# Patient Record
Sex: Female | Born: 1965 | Race: White | Hispanic: No | Marital: Married
Health system: Southern US, Community
[De-identification: ages and names within clinical notes are randomized; demographics above are authoritative.]

---

## 2013-08-17 ENCOUNTER — Ambulatory Visit: Payer: Self-pay | Admitting: Anesthesiology

## 2013-08-17 DIAGNOSIS — I10 Essential (primary) hypertension: Secondary | ICD-10-CM

## 2013-08-24 ENCOUNTER — Ambulatory Visit: Payer: Self-pay | Admitting: Urology

## 2013-08-24 LAB — CBC WITH DIFFERENTIAL/PLATELET
Basophil #: 0.1 10*3/uL (ref 0.0–0.1)
Basophil %: 1.1 %
Eosinophil %: 0.6 %
HGB: 10.1 g/dL — ABNORMAL LOW (ref 12.0–16.0)
MCH: 25.7 pg — ABNORMAL LOW (ref 26.0–34.0)
MCHC: 32.7 g/dL (ref 32.0–36.0)
MCV: 79 fL — ABNORMAL LOW (ref 80–100)
Monocyte #: 0.4 x10 3/mm (ref 0.2–0.9)
Monocyte %: 7.5 %
Neutrophil #: 3.9 10*3/uL (ref 1.4–6.5)
Neutrophil %: 67.5 %
Platelet: 319 10*3/uL (ref 150–440)
RBC: 3.95 10*6/uL (ref 3.80–5.20)
RDW: 14.1 % (ref 11.5–14.5)
WBC: 5.8 10*3/uL (ref 3.6–11.0)

## 2013-08-24 LAB — BASIC METABOLIC PANEL
Calcium, Total: 8.7 mg/dL (ref 8.5–10.1)
Chloride: 105 mmol/L (ref 98–107)
Creatinine: 0.68 mg/dL (ref 0.60–1.30)
EGFR (African American): >60
Osmolality: 272 (ref 275–301)

## 2013-08-25 LAB — CBC WITH DIFFERENTIAL/PLATELET
Basophil #: 0 10*3/uL (ref 0.0–0.1)
Basophil %: 0.2 %
Eosinophil #: 0 10*3/uL (ref 0.0–0.7)
HCT: 26.9 % — ABNORMAL LOW (ref 35.0–47.0)
Lymphocyte #: 1.2 10*3/uL (ref 1.0–3.6)
MCHC: 32.5 g/dL (ref 32.0–36.0)
Neutrophil %: 85.2 %
RDW: 14.4 % (ref 11.5–14.5)

## 2013-08-25 LAB — BASIC METABOLIC PANEL
Anion Gap: 3 — ABNORMAL LOW (ref 7–16)
BUN: 9 mg/dL (ref 7–18)
Calcium, Total: 8.4 mg/dL — ABNORMAL LOW (ref 8.5–10.1)
Chloride: 108 mmol/L — ABNORMAL HIGH (ref 98–107)
EGFR (African American): >60
EGFR (Non-African Amer.): >60
Glucose: 107 mg/dL — ABNORMAL HIGH (ref 65–99)
Osmolality: 271 (ref 275–301)
Potassium: 4.3 mmol/L (ref 3.5–5.1)
Sodium: 136 mmol/L (ref 136–145)

## 2013-10-12 ENCOUNTER — Ambulatory Visit: Payer: Self-pay | Admitting: Urology

## 2014-11-08 IMAGING — XA IR NG/OG TUBE BY MD
1 series · 2 of 2 positions shown · non-contrast
Comparison: none

CLINICAL DATA: Symptomatic right nephrolithiasis. Preop for
nephrolithotomy.

EXAM:
RIGHT PERCUTANEOUS NEPHROURETERAL CATHETER PLACEMENT UNDER
ULTRASOUND AND FLUOROSCOPIC GUIDANCE
TECHNIQUE: The procedure, risks (including but not limited to bleeding,
infection, organ damage ), benefits, and alternatives were explained
to the patient. Questions regarding the procedure were encouraged
and answered. The patient understands and consents to the procedure.

[Series 3: fl - angio · 2 of 2 slices shown]
[im 1/2]
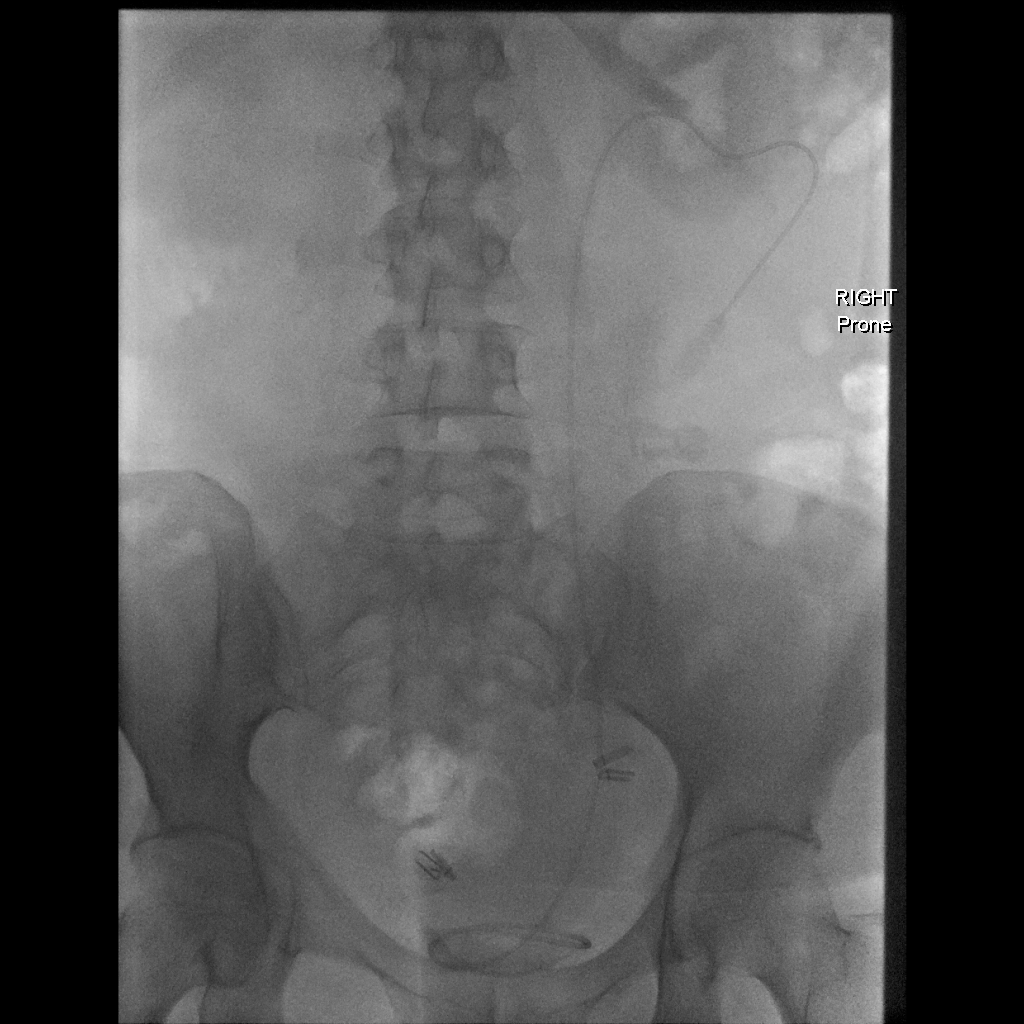
[im 2/2]
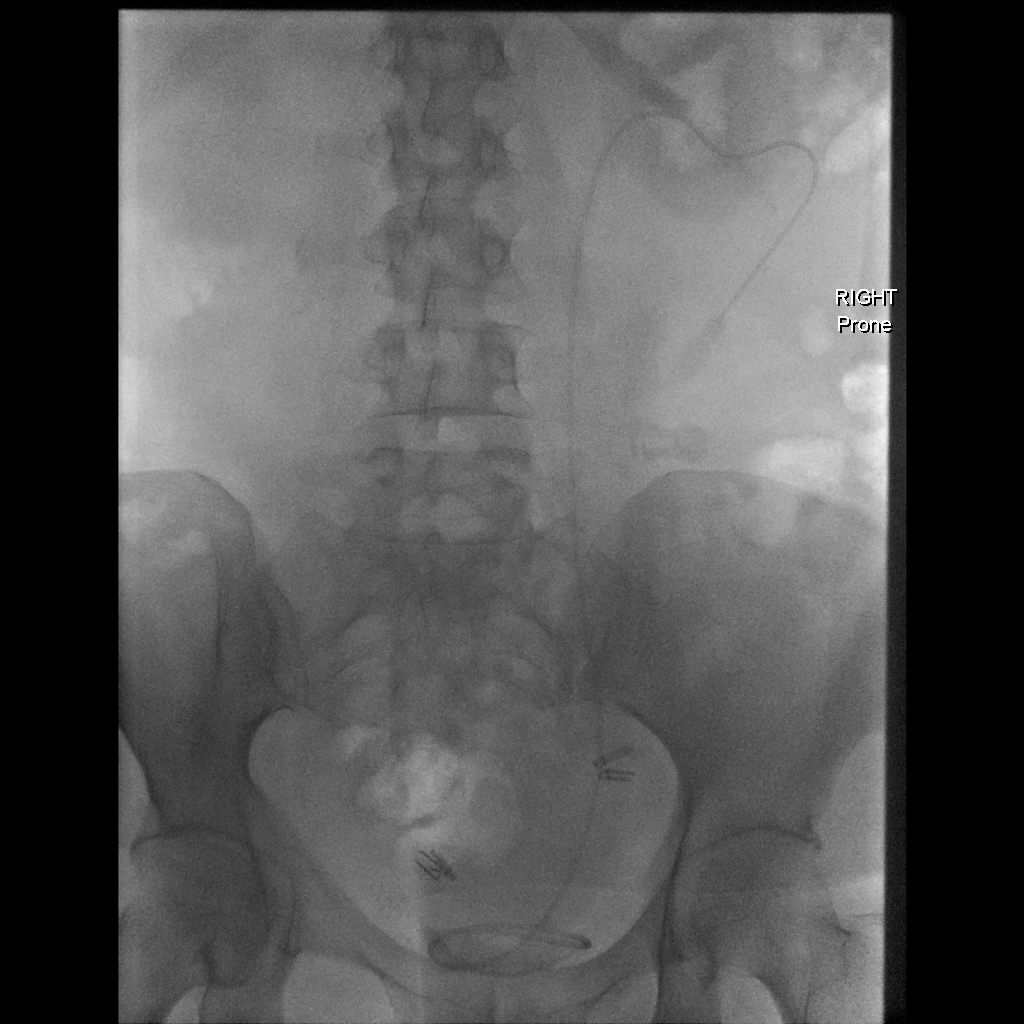

[2 of 2 positions shown; findings below may reference images not displayed]

RightFlank region prepped , draped in usual sterile fashion,
infiltrated locally with 1% lidocaine.

Intravenous Fentanyl and Versed were administered as conscious
sedation during continuous cardiorespiratory monitoring by the
radiology RN, with a total moderate sedation time of less than 30
minutes.

Under real-time ultrasound guidance, a 21-gauge trocar needle was
advanced into a posterior lower pole calyx directed towards the
dominant visible stone. Ultrasound image documentation was saved.
Under fluoroscopy, a 018 guidewire advanced easily around the stone
and down the ureter. Needle was exchanged over a guidewire for 3
French dilator. Contrast injection confirmed appropriate
positioning. Catheter was exchanged over a guidewire for a 5 French
angiographic catheter, advanced into the urinary bladder. Spot
radiograph confirms appropriate positioning. Catheter secured
externally with 0 Prolene suture and capped. The patient tolerated
the procedure well. No immediate complication.

FLUOROSCOPY TIME:  16 seconds
IMPRESSION: 1. Technically successful right antegrade percutaneous
nephroureteral catheter placement.

## 2014-11-08 IMAGING — US US INTRAOPERATIVE
1 series · 3 of 3 positions shown · non-contrast
Comparison: none

CLINICAL DATA: Symptomatic right nephrolithiasis. Preop for
nephrolithotomy.

EXAM:
RIGHT PERCUTANEOUS NEPHROURETERAL CATHETER PLACEMENT UNDER
ULTRASOUND AND FLUOROSCOPIC GUIDANCE
TECHNIQUE: The procedure, risks (including but not limited to bleeding,
infection, organ damage ), benefits, and alternatives were explained
to the patient. Questions regarding the procedure were encouraged
and answered. The patient understands and consents to the procedure.

[Series 1: us intraoperative · 0.28mm/px · 3 of 3 slices shown]
[im 1/3]
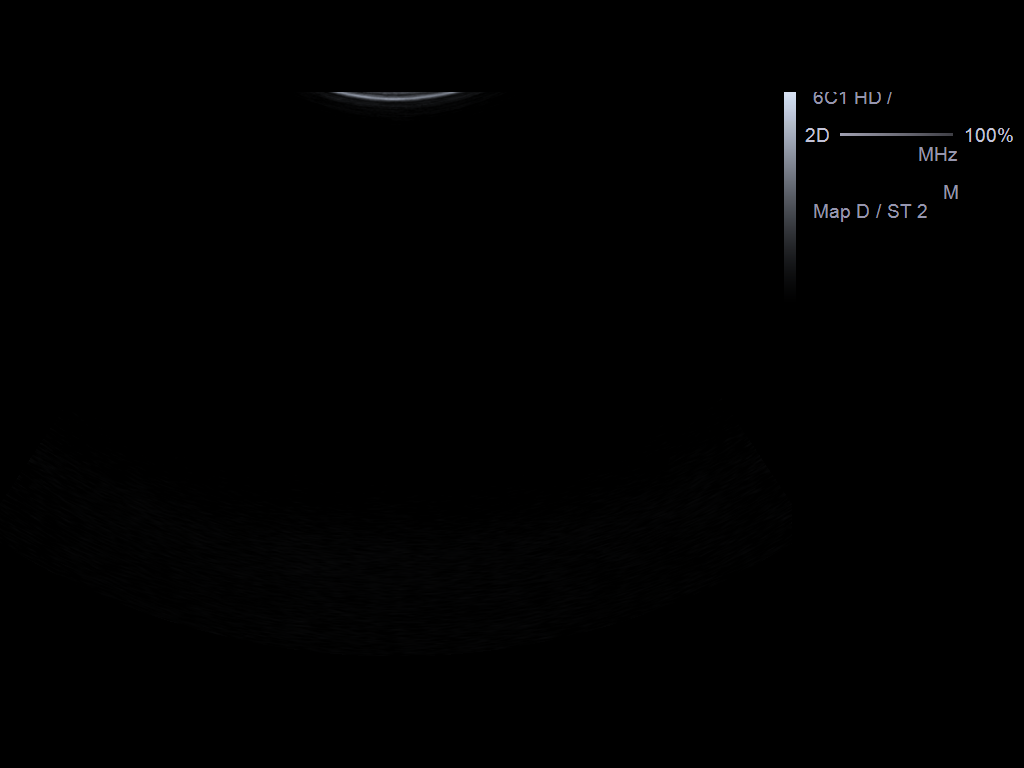
[im 2/3]
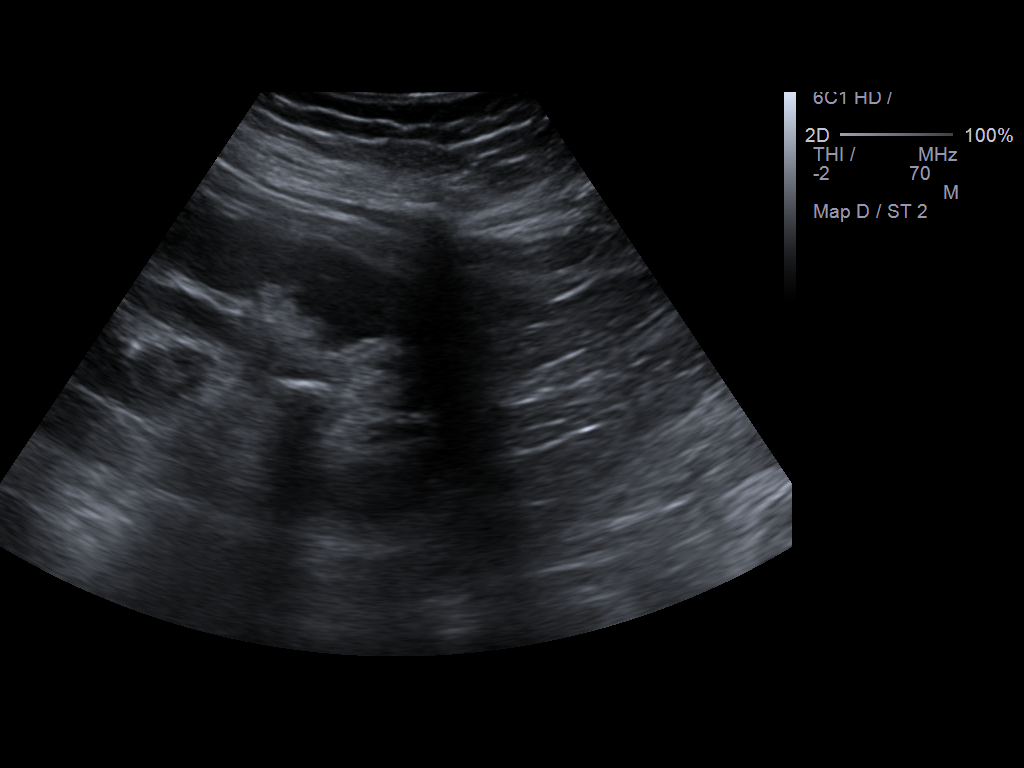
[im 3/3]
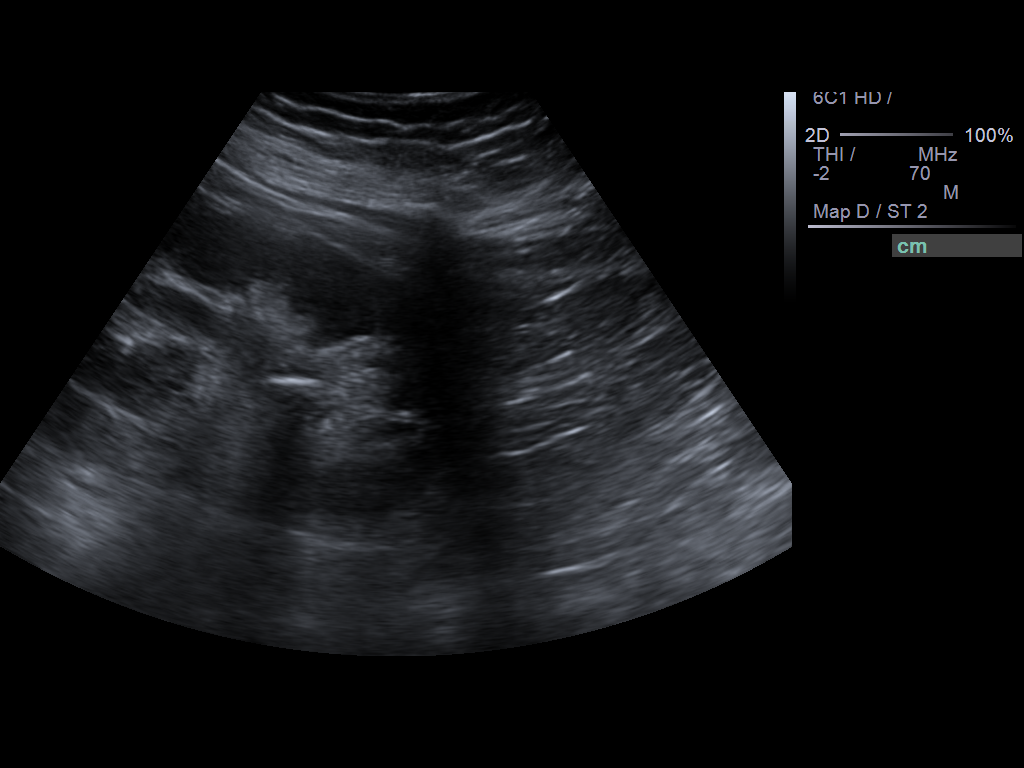

[3 of 3 positions shown; findings below may reference images not displayed]

RightFlank region prepped , draped in usual sterile fashion,
infiltrated locally with 1% lidocaine.

Intravenous Fentanyl and Versed were administered as conscious
sedation during continuous cardiorespiratory monitoring by the
radiology RN, with a total moderate sedation time of less than 30
minutes.

Under real-time ultrasound guidance, a 21-gauge trocar needle was
advanced into a posterior lower pole calyx directed towards the
dominant visible stone. Ultrasound image documentation was saved.
Under fluoroscopy, a 018 guidewire advanced easily around the stone
and down the ureter. Needle was exchanged over a guidewire for 3
French dilator. Contrast injection confirmed appropriate
positioning. Catheter was exchanged over a guidewire for a 5 French
angiographic catheter, advanced into the urinary bladder. Spot
radiograph confirms appropriate positioning. Catheter secured
externally with 0 Prolene suture and capped. The patient tolerated
the procedure well. No immediate complication.

FLUOROSCOPY TIME:  16 seconds
IMPRESSION: 1. Technically successful right antegrade percutaneous
nephroureteral catheter placement.

## 2014-11-09 IMAGING — XA IR NG/OG TUBE BY MD
10 series · 10 of 10 positions shown · non-contrast
Comparison: none

CLINICAL DATA: Recent percutaneous nephrolithotomy. Evaluate for
patency of the collecting system.

[Series 1: single · 1 of 1 slices shown (1 of 7)]
[im 1/1]
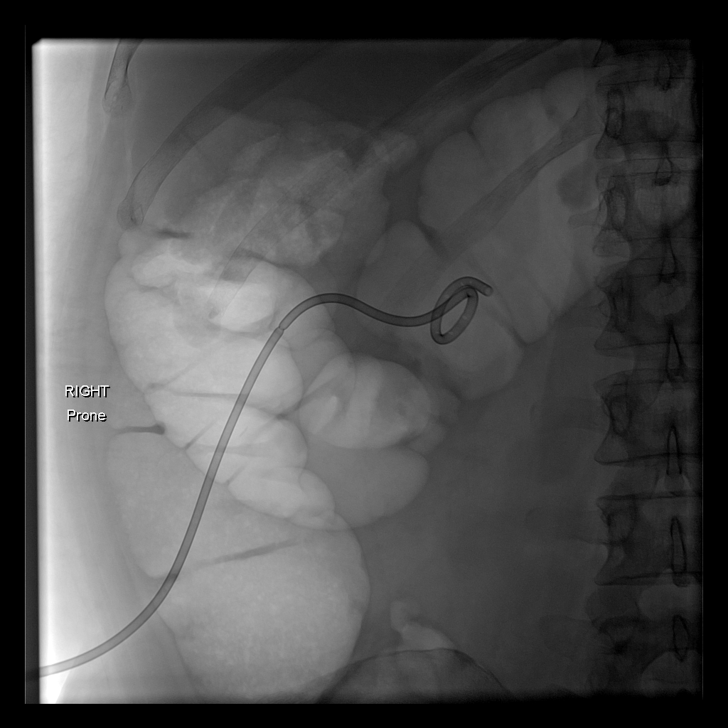

[Series 2: single · 1 of 1 slices shown (2 of 7)]
[im 1/1]
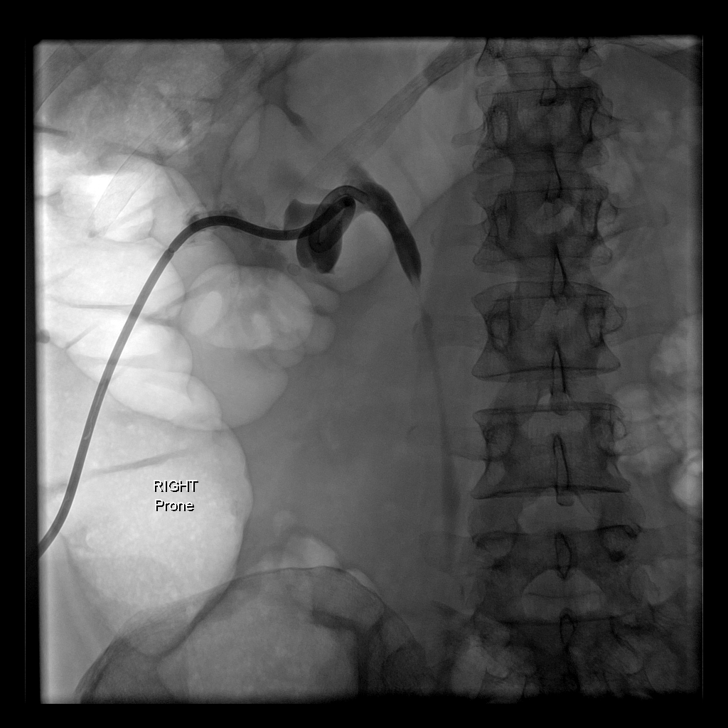

[Series 3: single · 1 of 1 slices shown (3 of 7)]
[im 1/1]
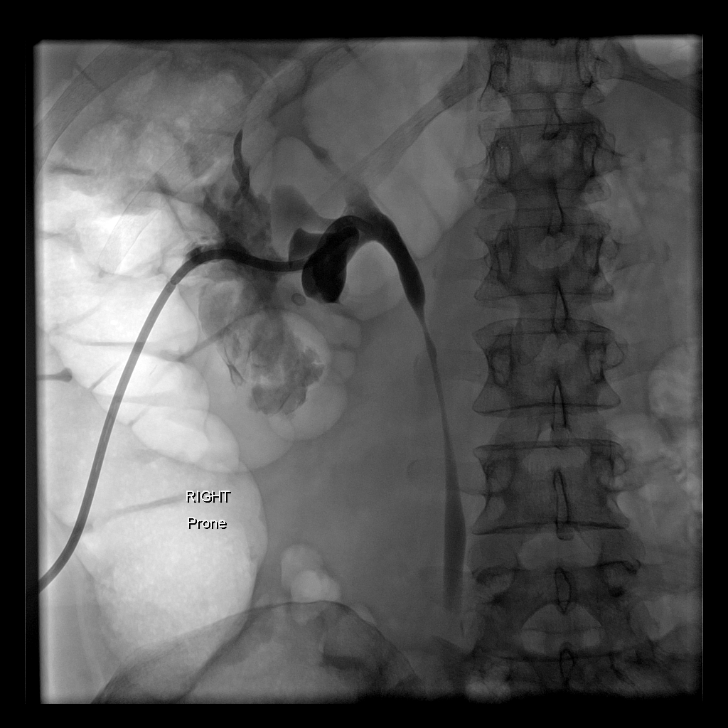

[Series 4: fl - angio · 1 of 1 slices shown (1 of 3)]
[im 1/1]
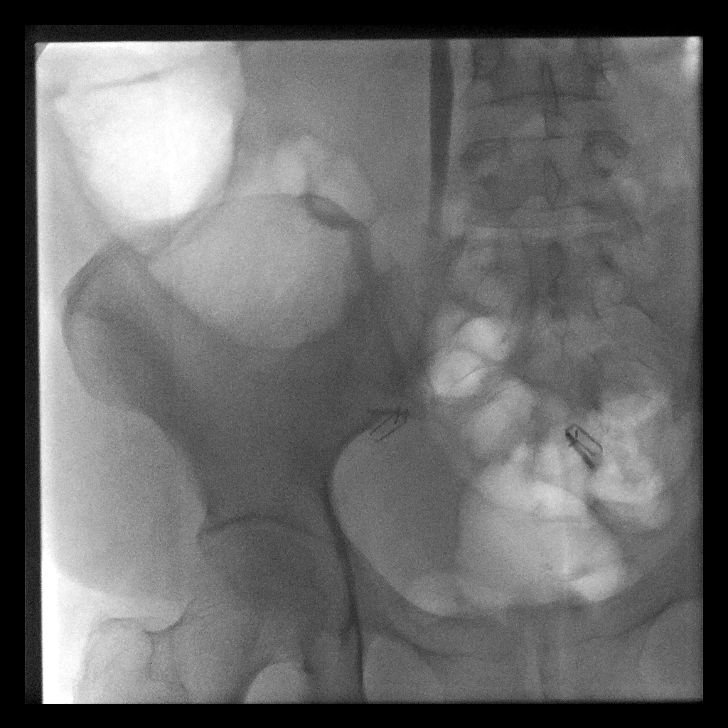

[Series 5: fl - angio · 1 of 1 slices shown (2 of 3)]
[im 1/1]
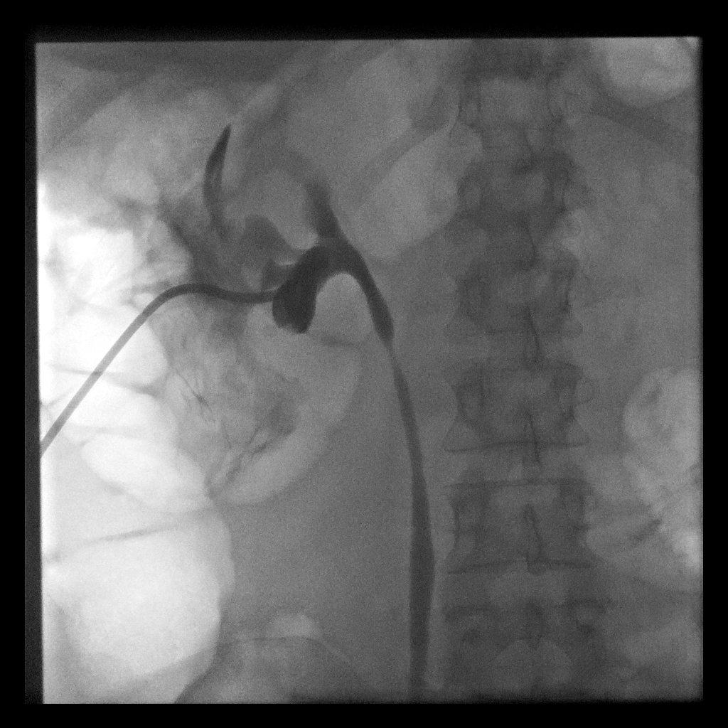

[Series 6: fl - angio · 1 of 1 slices shown (3 of 3)]
[im 1/1]
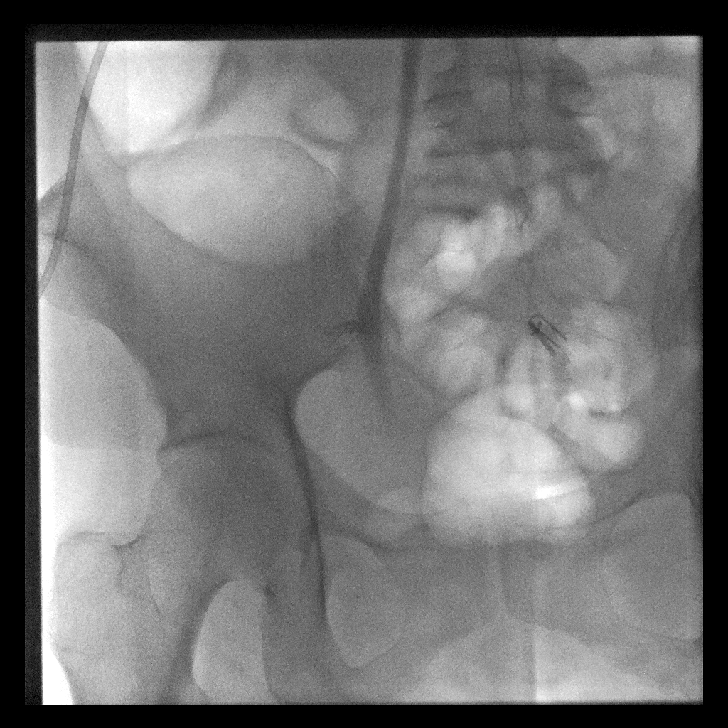

[Series 7: single · 1 of 1 slices shown (4 of 7)]
[im 1/1]
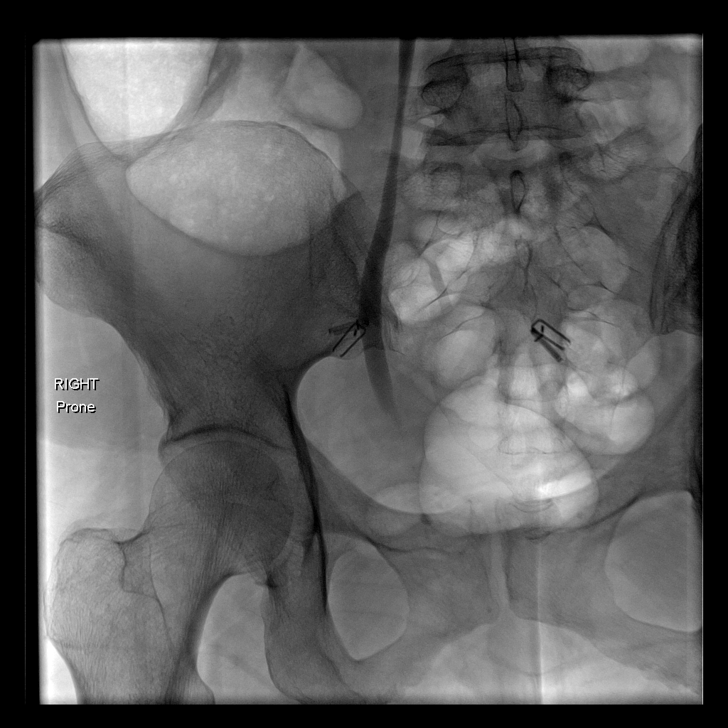

[Series 8: single · 1 of 1 slices shown (5 of 7)]
[im 1/1]
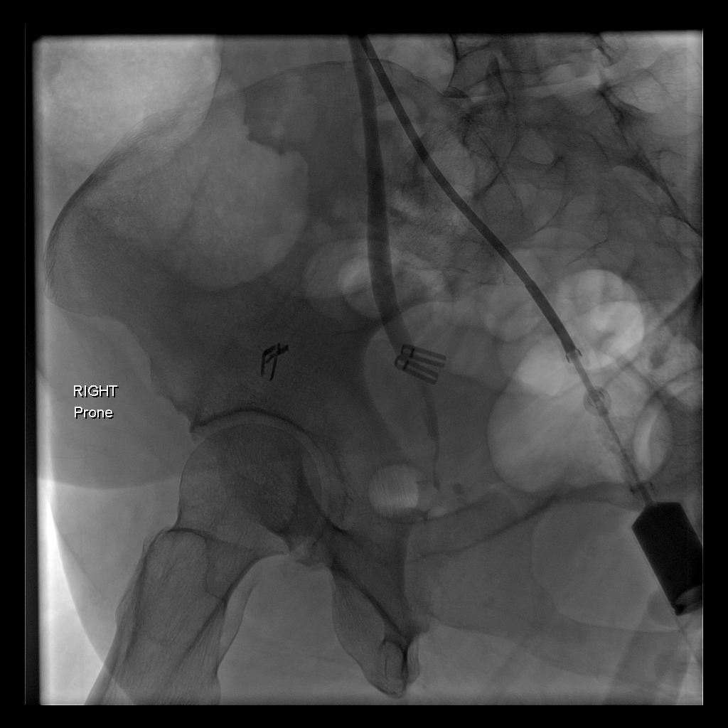

[Series 9: single · 1 of 1 slices shown (6 of 7)]
[im 1/1]
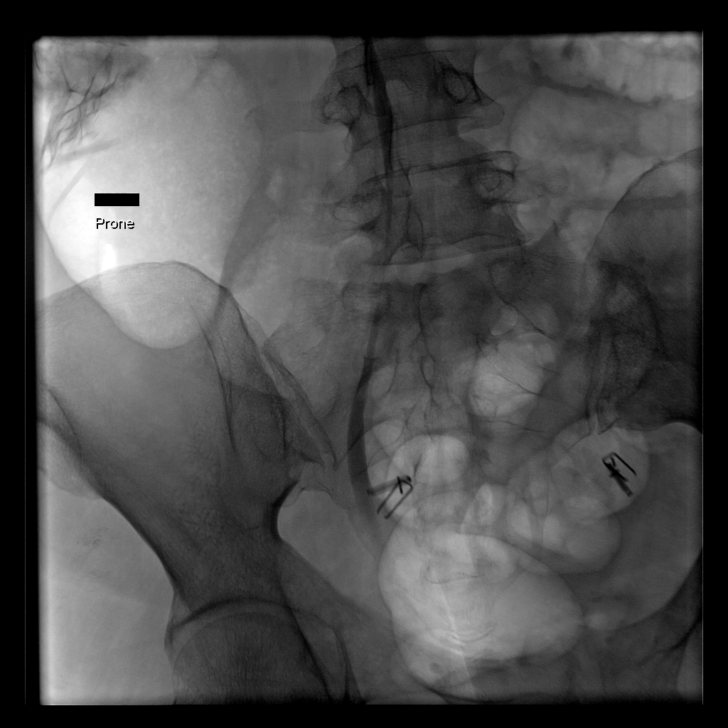

[Series 10: single · 1 of 1 slices shown (7 of 7)]
[im 1/1]
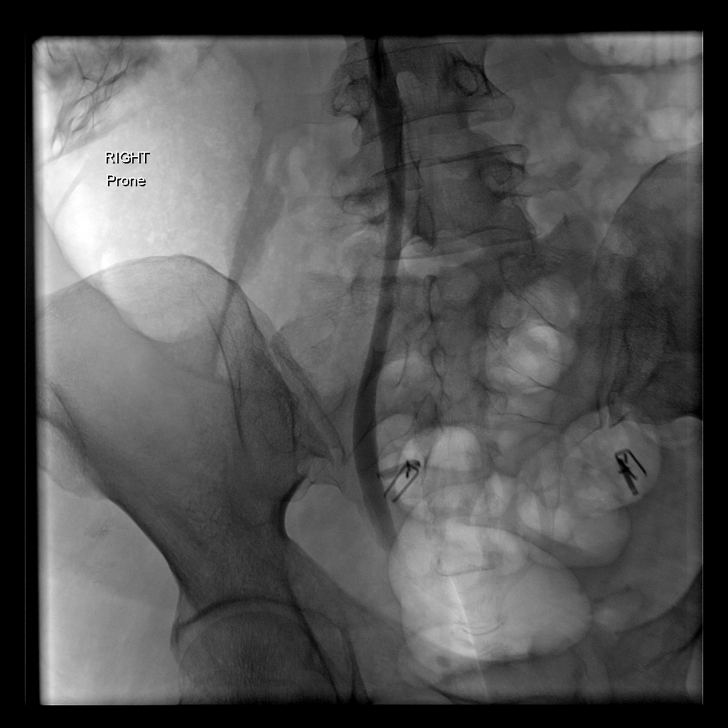

[10 of 10 positions shown; findings below may reference images not displayed]

EXAM:
RIGHT NEPHROSTOGRAM WITH FLUOROSCOPY

MEDICATIONS AND MEDICAL HISTORY:
None

ANESTHESIA/SEDATION:
Moderate sedation time: None

CONTRAST:  15 ml [id number]

FLUOROSCOPY TIME:  2 min

PROCEDURE:
The patient was placed prone on the interventional table. Radiograph
was taken of the right flank. Contrast was injected through the
catheter. At the end of the procedure, the catheter was cut and
easily removed. Bandage placed over the old tube site.

COMPLICATIONS:
None
FINDINGS: Right nephrostomy tube was well positioned in the right renal
pelvis. No definite radiopaque stones in the renal collecting system
but there could be a small stone just outside of the collecting
system in the lower pole the roughly measures 4 mm. Injection of
contrast demonstrates a decompressed renal collecting system and
contrast preferentially drains into the perinephric space. As a
result, it was very difficult to opacify the distal ureter. The
patient was rolled on her side in order to opacify the distal
ureter. A small amount of contrast was identified draining into the
bladder.
IMPRESSION: The renal collecting system is decompressed and no definite stones
within the renal collecting system. There may be a small 4 mm stone
or calcification in the lower pole but outside of the collecting
system.

Nephrostomy tube was successfully removed.

## 2015-01-20 NOTE — Discharge Summary (Signed)
PATIENT NAME:  Peggy Browning, Peggy Browning DATE OF BIRTH:  06-01-1966  DATE OF ADMISSION:  08/24/2013 DATE OF DISCHARGE:  08/26/2013  PRINCIPAL DIAGNOSIS: Right nephrolithiasis.   PROCEDURE: Right percutaneous nephrolithotomy.   INDICATIONS: The patient is a 49 year old white female with a long history of nephrolithiasis. She underwent recent evaluation for pain and discomfort. She was found to have a 1.7 cm right mid renal calculus with two smaller stones in the lower pole portion of the kidney. She has elected to proceed with percutaneous nephrolithotomy. She presents for this purpose.   HOSPITAL COURSE: Ms. Peggy Browning was admitted on 08/24/2013. She went to interventional radiology, at which time she underwent a right percutaneous nephroureteral stent placement. She was then taken to the Operating Room, at which time she underwent a right percutaneous nephrolithotomy. There were no intraoperative problems or complications. Bleeding was minimal. The stones were removed in their entirety without difficulty. The patient's postoperative course was without significant problems or complications. She remained afebrile, vital signs stable. She had moderate pain and discomfort, which was consistent with post procedural pain. This was controlled on IV and oral pain medication. She had subsequent clearing of her urine. Her site remained clean, dry, and intact. She was tolerating a general diet without difficulty. She was also ambulating without difficulty. The tube was clamped on postoperative day one. She had no increase in pain or discomfort. She had the tube removed by interventional radiology on the evening of postoperative day one in attempts at discharge prior to the holiday; however, her pain was aggravated by the tube removal. This was subsequently controlled. She continued to improve and was subsequently discharged the following morning on 08/26/2013. She was discharged on her usual admission medications  in addition to Cipro 500 mg for a 7 day course and Dilaudid 4 mg every six hours as needed. She is to follow-up in approximately 7 to 10 days for suture removal. She was to avoid heavy lifting or straining for at least 10 days. She is to notify us if there are any further problems or questions in the interim.     ____________________________ Madolyn FriezeBrian S. Achilles Dunkope, MD bsc:sg D: 08/30/2013 09:05:14 ET T: 08/30/2013 09:29:32 ET JOB#: [id number]  cc: Madolyn FriezeBrian S. Achilles Dunkope, MD, <Dictator> Madolyn FriezeBRIAN S Nillie Bartolotta MD ELECTRONICALLY SIGNED 08/31/2013 7:13

## 2015-01-20 NOTE — Op Note (Signed)
PATIENT NAME:  Peggy Browning, Peggy Browning MR#:  [id number] DATE OF BIRTH:  [birth year]  DATE OF PROCEDURE:  08/24/2013  PRINCIPAL DIAGNOSIS: Right nephrolithiasis.   POSTOPERATIVE DIAGNOSIS: Right nephrolithiasis.   PROCEDURE: Right percutaneous nephrolithotomy.   SURGEON: Assunta GamblesBrian Jafeth Mustin, M.D.   ANESTHESIA: General endotracheal anesthesia.   INDICATIONS: The patient is a 49 year old white female with a history of renal calculi. She was recently found to have a 1.7 cm stone in the mid pole portion of the right kidney with 2 additional stones in the lower and lower lateral portions of the kidney. She has elected to proceed with percutaneous nephrolithotomy. She presents for this purpose.   DESCRIPTION OF PROCEDURE: After informed consent was obtained, the patient was taken to the operating room and placed in the prone position on the operating table under general endotracheal anesthesia. The patient was then prepped and draped in the usual standard fashion. A previously placed nephroureteral stent was in place. This had been placed in interventional radiology the morning of the procedure just prior. The nephroureteral stent was utilized for access. A guidewire was advanced through the nephroureteral stent into the urinary bladder under fluoroscopic guidance. The nephroureteral stent was then removed. The 12-French dilator was placed over the guidewire to the level of the stone under fluoroscopic guidance. The stiffener was then placed over the guidewire. The sheath was then placed over the stiffener. The stiffener was removed. The Superstiff guidewire was advanced through the sheath into the urinary bladder. The sheath was then removed. The stiffener was replaced over the Superstiff guidewire. Dilation was then performed from 14-French to 28-French utilizing the Amplatz dilators. The 28-French sheath was utilized for access over the 28-French dilator.   Once the sheath was in place, nephroscopy was then performed in  the standard fashion. Upon entering the renal pelvis, some blood clot was present. The tip of the stone, however, could be visualized. The ultrasonic probe and suction was utilized to remove the bulk of the blood clot. The stone was then fragmented into multiple smaller pieces utilizing the ultrasonic device. Some of the pieces were collected and will be sent for stone analysis. A number of stone fragments were noted to progress into the lower and lateral calyces. These were also removed utilizing the ultrasonic device. It was difficult to determine which were fragments and which were the other 2 stones in the other portion of the kidney. Complete removal, however, was achieved.   At the completion of the removal, the flexible cystoscope was utilized to advance into the upper pole calyces and to several lateral calyces. No additional stones were appreciated. Several small Randall plaques were noted. The scope was easily advanced past the level of the ureteropelvic junction into the proximal ureter with no stone fragments noted. Minimal edema was present within the renal pelvis. No significant edema was present at the UPJ region. The decision was made not to place a stent given these findings. The cystoscope was removed. A 10-French nephrostomy tube was advanced through the sheath. The trocar was removed. Gentle traction was placed on the string providing good curl within the renal pelvis. The sheath was then removed. The nephrostomy tube was secured to the skin utilizing a 0 Prolene suture.   Three additional interrupted 0 Prolene sutures were utilized to close the skin incision site. A nephrostogram was then performed demonstrating good visualization of all of the calyces with contrast rapidly progressing into the ureter without evidence of obstruction. Minimal extravasation was present. A gauze dressing was  applied with Microfoam tape. The patient was then returned to the supine position. She was awakened from  general endotracheal anesthesia. She was taken to the recovery room in stable condition. There were no problems or complications. The patient tolerated the procedure well.   ESTIMATED BLOOD LOSS: Approximately 75 mL.   ____________________________ Madolyn Frieze. Achilles Dunk, MD bsc:np D: 08/25/2013 22:11:23 ET T: 08/25/2013 22:35:53 ET JOB#: [id number]  cc: Madolyn Frieze. Achilles Dunk, MD, <Dictator> Madolyn Frieze Aldwin Micalizzi MD ELECTRONICALLY SIGNED 08/30/2013 9:14

## 2015-01-21 NOTE — Op Note (Signed)
PATIENT NAME:  Peggy Browning, [name] MR#:  [id number] DATE OF BIRTH:  30-Jun-1966  DATE OF PROCEDURE:  10/12/2013  PRINCIPAL DIAGNOSES: Bilateral nephrolithiasis, right ureterolithiasis, bilateral renal colic.   POSTOPERATIVE DIAGNOSIS: Bilateral nephrolithiasis, passed right ureteral stone, bilateral renal colic.   PROCEDURE: Bilateral ureteroscopy.   SURGEON: Assunta GamblesBrian Jaana Brodt, M.D.   ANESTHESIA: Laryngeal mask airway anesthesia.   INDICATIONS: The patient is a 49 year old white female with a long history of nephrolithiasis. She underwent recent right percutaneous nephrolithotomy for a large right renal calculus. No definitive stone fragments were appreciated at the time of the procedure. She began experiencing intermittent right flank discomfort shortly after the procedure and initial film demonstrated no significant definitive stones. A subsequent followup image demonstrated a new 4 to 5 mm density in the region of the right UVJ. This appears to be consistent with a stone fragment, likely progressing from the kidney after the percutaneous nephrolithotomy. She was also experiencing intermittent worsening left flank discomfort with no definitive stones noted within the course of the ureter. There has been failure of progression with continued discomfort. We have elected to proceed with right ureteroscopy with holmium laser lithotripsy and possible stent placement due to the pain and discomfort on the left. We will also proceed with evaluation on that side.   DESCRIPTION OF PROCEDURE: After informed consent was obtained, the patient was taken to the Operating Room and placed in the dorsal lithotomy position under laryngeal mask airway anesthesia. The patient was then prepped and draped in the usual standard fashion. The 22-French rigid cystoscope was introduced into the urethra under direct vision with no urethral abnormalities noted. Upon entering the bladder, the mucosa was inspected in its entirety with no  gross mucosal lesions noted. Bilateral ureteral orifices were well visualized. No abnormalities were noted on the left. The right was noted to be mildly erythematous with no other significant abnormalities appreciated. A flexible-tipped Glidewire was introduced into the right ureteral orifice without difficulty and advanced into the upper pole collecting system. The cystoscope was removed. The 6-French rigid ureteroscope was advanced into the urinary bladder. It was advanced easily into the right ureteral orifice. It was easily advanced to the level of the ureteropelvic junction with no evidence of stone within the portion of the ureter. The lower distal portion of the ureter at the UVJ did demonstrate mild inflammation consistent with recent stone passage. The upper and upper lateral calyces of the kidney were inspected with no stones noted. The trauma from the recent nephrolithotomy has completely healed, no significant abnormalities were appreciated. The ureter was re-examined upon withdrawal of the scope with no additional abnormalities appreciated. The guidewire was then placed through the ureteroscope with the ureteroscope remaining within the bladder. The guidewire was advanced into the left ureteral orifice. It was advanced into the upper pole collecting system without difficulty. The ureteroscope was then advanced into the left ureter without difficulty. It was easily advanced throughout the entire ureter to the level of the ureteropelvic junction. There was no evidence of stone, erythema or other significant abnormalities noted. The upper pole and upper lateral calyces were also visualized with the ureteroscope with no other abnormalities appreciated. The ureter was re-examined upon withdrawal of the scope with no other abnormalities appreciated. The ureteroscope was removed. The cystoscope was replaced back into the urinary bladder. The bladder was drained. The cystoscope was removed. An 18-French red  rubber catheter was inserted into the urinary bladder. Then 40 mL of 2% lidocaine was instilled into the urinary  bladder for anesthesia. The catheter was then removed. The patient was returned to the supine position and awakened from laryngeal mask airway anesthesia. She was taken to the recovery room in stable condition. There were no problems or complications. The patient tolerated the procedure well.   ____________________________ Madolyn Frieze. Achilles Dunk, MD bsc:cs D: 10/13/2013 19:08:54 ET T: 10/13/2013 19:44:17 ET JOB#: [id number]  cc: Madolyn Frieze. Achilles Dunk, MD, <Dictator> Madolyn Frieze Verma Grothaus MD ELECTRONICALLY SIGNED 10/13/2013 22:39

## 2019-12-27 ENCOUNTER — Other Ambulatory Visit: Payer: Self-pay | Admitting: Physician Assistant

## 2019-12-27 DIAGNOSIS — H93A9 Pulsatile tinnitus, unspecified ear: Secondary | ICD-10-CM

## 2019-12-30 ENCOUNTER — Ambulatory Visit: Payer: BC Managed Care – PPO

## 2020-02-07 ENCOUNTER — Ambulatory Visit: Payer: Self-pay | Admitting: Internal Medicine

## 2020-03-17 ENCOUNTER — Ambulatory Visit: Payer: Self-pay | Admitting: Internal Medicine
